# Patient Record
Sex: Female | Born: 1995 | Race: White | Hispanic: No | Marital: Single | Smoking: Never smoker
Health system: Southern US, Community
[De-identification: ages and names within clinical notes are randomized; demographics above are authoritative.]

## PROBLEM LIST (undated history)

## (undated) DIAGNOSIS — M6752 Plica syndrome, left knee: Secondary | ICD-10-CM

---
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
## 2015-08-25 ENCOUNTER — Emergency Department
Admission: EM | Admit: 2015-08-25 | Discharge: 2015-08-25 | Disposition: A | Discharge status: Home / Self Care | Payer: BLUE CROSS/BLUE SHIELD | Source: Home / Self Care | Attending: Family Medicine | Admitting: Family Medicine

## 2015-08-25 ENCOUNTER — Encounter: Payer: Self-pay | Admitting: *Deleted

## 2015-08-25 DIAGNOSIS — J329 Chronic sinusitis, unspecified: Secondary | ICD-10-CM | POA: Diagnosis not present

## 2015-08-25 MED ORDER — DOXYCYCLINE HYCLATE 100 MG PO CAPS: 100.0000 mg | ORAL_CAPSULE | Freq: Two times a day (BID) | ORAL | Status: DC

## 2015-08-25 MED ORDER — FLUTICASONE PROPIONATE 50 MCG/ACT NA SUSP: 2.0000 | Freq: Every day | NASAL | Status: AC

## 2015-08-25 MED ORDER — PSEUDOEPHEDRINE HCL 60 MG PO TABS: 60.0000 mg | ORAL_TABLET | Freq: Four times a day (QID) | ORAL | Status: DC | PRN

## 2015-08-25 MED ORDER — PREDNISONE 20 MG PO TABS: ORAL_TABLET | ORAL | Status: DC

## 2015-08-25 NOTE — Discharge Instructions (Signed)
You may take 400-600mg Ibuprofen (Motrin) every 6-8 hours for fever and pain  °Alternate with Tylenol  °You may take 500mg Tylenol every 4-6 hours as needed for fever and pain  °Follow-up with your primary care provider next week for recheck of symptoms if not improving.  °Be sure to drink plenty of fluids and rest, at least 8hrs of sleep a night, preferably more while you are sick. °Return urgent care or go to closest ER if you cannot keep down fluids/signs of dehydration, fever not reducing with Tylenol, difficulty breathing/wheezing, stiff neck, worsening condition, or other concerns (see below)  °Please take antibiotics as prescribed and be sure to complete entire course even if you start to feel better to ensure infection does not come back. ° °Sinusitis, Adult °Sinusitis is redness, soreness, and inflammation of the paranasal sinuses. Paranasal sinuses are air pockets within the bones of your face. They are located beneath your eyes, in the middle of your forehead, and above your eyes. In healthy paranasal sinuses, mucus is able to drain out, and air is able to circulate through them by way of your nose. However, when your paranasal sinuses are inflamed, mucus and air can become trapped. This can allow bacteria and other germs to grow and cause infection. °Sinusitis can develop quickly and last only a short time (acute) or continue over a long period (chronic). Sinusitis that lasts for more than 12 weeks is considered chronic. °CAUSES °Causes of sinusitis include: °· Allergies. °· Structural abnormalities, such as displacement of the cartilage that separates your nostrils (deviated septum), which can decrease the air flow through your nose and sinuses and affect sinus drainage. °· Functional abnormalities, such as when the small hairs (cilia) that line your sinuses and help remove mucus do not work properly or are not present. °SIGNS AND SYMPTOMS °Symptoms of acute and chronic sinusitis are the same. The  primary symptoms are pain and pressure around the affected sinuses. Other symptoms include: °· Upper toothache. °· Earache. °· Headache. °· Bad breath. °· Decreased sense of smell and taste. °· A cough, which worsens when you are lying flat. °· Fatigue. °· Fever. °· Thick drainage from your nose, which often is green and may contain pus (purulent). °· Swelling and warmth over the affected sinuses. °DIAGNOSIS °Your health care provider will perform a physical exam. During your exam, your health care provider may perform any of the following to help determine if you have acute sinusitis or chronic sinusitis: °· Look in your nose for signs of abnormal growths in your nostrils (nasal polyps). °· Tap over the affected sinus to check for signs of infection. °· View the inside of your sinuses using an imaging device that has a light attached (endoscope). °If your health care provider suspects that you have chronic sinusitis, one or more of the following tests may be recommended: °· Allergy tests. °· Nasal culture. A sample of mucus is taken from your nose, sent to a lab, and screened for bacteria. °· Nasal cytology. A sample of mucus is taken from your nose and examined by your health care provider to determine if your sinusitis is related to an allergy. °TREATMENT °Most cases of acute sinusitis are related to a viral infection and will resolve on their own within 10 days. Sometimes, medicines are prescribed to help relieve symptoms of both acute and chronic sinusitis. These may include pain medicines, decongestants, nasal steroid sprays, or saline sprays. °However, for sinusitis related to a bacterial infection, your health care   provider will prescribe antibiotic medicines. These are medicines that will help kill the bacteria causing the infection. °Rarely, sinusitis is caused by a fungal infection. In these cases, your health care provider will prescribe antifungal medicine. °For some cases of chronic sinusitis, surgery  is needed. Generally, these are cases in which sinusitis recurs more than 3 times per year, despite other treatments. °HOME CARE INSTRUCTIONS °· Drink plenty of water. Water helps thin the mucus so your sinuses can drain more easily. °· Use a humidifier. °· Inhale steam 3-4 times a day (for example, sit in the bathroom with the shower running). °· Apply a warm, moist washcloth to your face 3-4 times a day, or as directed by your health care provider. °· Use saline nasal sprays to help moisten and clean your sinuses. °· Take medicines only as directed by your health care provider. °· If you were prescribed either an antibiotic or antifungal medicine, finish it all even if you start to feel better. °SEEK IMMEDIATE MEDICAL CARE IF: °· You have increasing pain or severe headaches. °· You have nausea, vomiting, or drowsiness. °· You have swelling around your face. °· You have vision problems. °· You have a stiff neck. °· You have difficulty breathing. °  °This information is not intended to replace advice given to you by your health care provider. Make sure you discuss any questions you have with your health care provider. °  °Document Released: 08/12/2005 Document Revised: 09/02/2014 Document Reviewed: 08/27/2011 °Elsevier Interactive Patient Education ©2016 [employer] ° °

## 2015-08-25 NOTE — ED Notes (Signed)
Pt c/o 1 week of sinus issues, runny nose, congestion and sore throat. Afebrile. She was treated with antibiotc and prednisone >1 months ago for similar illness and improved. Again about 2 weeks ago treated with antibiotic and improved. Now has returned 1 week ago.

## 2015-08-25 NOTE — ED Provider Notes (Signed)
CSN: [id number]     Arrival date & time 08/25/15  1122 History   First MD Initiated Contact with Patient 08/25/15 1128     Chief Complaint  Patient presents with  . Nasal Congestion  . Cough  . Sinus Problem   (Consider location/radiation/quality/duration/timing/severity/associated sxs/prior Treatment) HPI Pt is a 19yo female presenting to [facility] with c/o recurrent severe nasal congestion with associated frontal headache, sinus pressure, frequent sneezing, and sore throat.  Symptoms first started about 1 month ago. She was prescribed amoxicillin and prednisone at that time.  Symptoms resolved but then came back about 2 weeks ago.  Pt believes she was prescribed Azithromycin as she only had 5 days of antibiotics. Symptoms did start to go away but have now worsened over the last 1 week.  Denies known hx of seasonal allergies. Denies fever, chills, n/v/d.  Past Medical History  Diagnosis Date  . Plica syndrome of left knee    Past Surgical History  Procedure Laterality Date  . Knee arthroscopy Left 12/17/2012    Procedure: LEFT KNEE ARTHROSCOPY WITH ;  Surgeon: Loreta Ave, MD;  Location: [facility];  Service: Orthopedics;  Laterality: Left;  excision of plica  . Synovectomy Left 12/17/2012    Procedure: SYNOVECTOMY LIMITED ;  Surgeon: Loreta Ave, MD;  Location: [facility];  Service: Orthopedics;  Laterality: Left;   Family History  Problem Relation Age of Onset  . Hypertension Mother    Social History  Substance Use Topics  . Smoking status: Never Smoker   . Smokeless tobacco: None  . Alcohol Use: No   OB History    No data available     Review of Systems  Constitutional: Negative for fever and chills.  HENT: Positive for congestion, postnasal drip, rhinorrhea, sinus pressure, sneezing, sore throat and voice change. Negative for ear pain and trouble swallowing.   Respiratory: Negative for cough and shortness of breath.   Cardiovascular:  Negative for chest pain and palpitations.  Gastrointestinal: Negative for nausea, vomiting, abdominal pain and diarrhea.  Musculoskeletal: Negative for myalgias, back pain and arthralgias.  Skin: Negative for rash.  Neurological: Positive for headaches. Negative for dizziness and light-headedness.    Allergies  Review of patient's allergies indicates no known allergies.  Home Medications   Prior to Admission medications   Medication Sig Start Date End Date Taking? Authorizing Provider  doxycycline (VIBRAMYCIN) 100 MG capsule Take 1 capsule (100 mg total) by mouth 2 (two) times daily. One po bid x 7 days 08/25/15   Junius Finner, PA-C  fluticasone [facility]) 50 MCG/ACT nasal spray Place 2 sprays into both nostrils daily. 08/25/15   Junius Finner, PA-C  predniSONE (DELTASONE) 20 MG tablet 3 tabs po day one, then 2 po daily x 4 days 08/25/15   Junius Finner, PA-C  pseudoephedrine (SUDAFED) 60 MG tablet Take 1 tablet (60 mg total) by mouth every 6 (six) hours as needed for congestion. 08/25/15   Junius Finner, PA-C   Meds Ordered and Administered this Visit  Medications - No data to display  BP 109/72 mmHg  Pulse 102  Temp(Src) 99.1 F (37.3 C) (Oral)  Resp 16  Wt 141 lb (63.957 kg)  SpO2 99%  LMP 08/04/2015 No data found.   Physical Exam  Constitutional: She appears well-developed and well-nourished. No distress.  HENT:  Head: Normocephalic and atraumatic.  Right Ear: Hearing, external ear and ear canal normal. A middle ear effusion is present.  Left Ear: Hearing,  tympanic membrane, external ear and ear canal normal.  Nose: Mucosal edema and rhinorrhea present. Right sinus exhibits maxillary sinus tenderness and frontal sinus tenderness. Left sinus exhibits maxillary sinus tenderness. Left sinus exhibits no frontal sinus tenderness.  Mouth/Throat: Uvula is midline, oropharynx is clear and moist and mucous membranes are normal.  Eyes: Conjunctivae are normal. No scleral icterus.   Neck: Normal range of motion. Neck supple.  Cardiovascular: Normal rate, regular rhythm and normal heart sounds.   Pulmonary/Chest: Effort normal and breath sounds normal. No stridor. No respiratory distress. She has no wheezes. She has no rales. She exhibits no tenderness.  Abdominal: Soft. Bowel sounds are normal. She exhibits no distension and no mass. There is no tenderness. There is no rebound and no guarding.  Musculoskeletal: Normal range of motion.  Lymphadenopathy:    She has no cervical adenopathy.  Neurological: She is alert.  Skin: Skin is warm and dry. She is not diaphoretic.  Nursing note and vitals reviewed.   ED Course  Procedures (including critical care time)  Labs Review Labs Reviewed - No data to display  Imaging Review No results found.     MDM   1. Recurrent rhinosinusitis    Pt c/o recurrent, worsening sinus congestion and pressure.  She has been on amoxicillin and azithromycin with only temporary relief. Temp 99.1 F today in UC.  She does have severe nasal congestion with sinus tenderness.  Pt is not concerned for pregnancy and is not trying to become pregnant.   Will try a course of Doxycycline and another course of prednisone. Encouraged pt to use Flonase and may take sudafed as well. If not improving in 7-10 days, f/u with PCP for recheck of symptoms.  Patient and mother verbalized understanding and agreement with treatment plan.     Junius Finnerrin O'Malley, PA-C 08/25/15 [phone]

## 2018-09-30 DIAGNOSIS — J028 Acute pharyngitis due to other specified organisms: Secondary | ICD-10-CM | POA: Diagnosis not present

## 2018-09-30 DIAGNOSIS — J00 Acute nasopharyngitis [common cold]: Secondary | ICD-10-CM | POA: Diagnosis not present

## 2019-03-09 DIAGNOSIS — Z789 Other specified health status: Secondary | ICD-10-CM | POA: Diagnosis not present

## 2019-03-09 DIAGNOSIS — Z0000 Encounter for general adult medical examination without abnormal findings: Secondary | ICD-10-CM | POA: Diagnosis not present

## 2019-03-09 DIAGNOSIS — Z3041 Encounter for surveillance of contraceptive pills: Secondary | ICD-10-CM | POA: Diagnosis not present

## 2019-03-09 DIAGNOSIS — Z7721 Contact with and (suspected) exposure to potentially hazardous body fluids: Secondary | ICD-10-CM | POA: Diagnosis not present

## 2019-03-09 DIAGNOSIS — Z111 Encounter for screening for respiratory tuberculosis: Secondary | ICD-10-CM | POA: Diagnosis not present

## 2019-03-09 DIAGNOSIS — Z23 Encounter for immunization: Secondary | ICD-10-CM | POA: Diagnosis not present

## 2019-03-29 DIAGNOSIS — Z23 Encounter for immunization: Secondary | ICD-10-CM | POA: Diagnosis not present

## 2019-04-28 DIAGNOSIS — M546 Pain in thoracic spine: Secondary | ICD-10-CM | POA: Diagnosis not present

## 2019-04-28 DIAGNOSIS — S42022A Displaced fracture of shaft of left clavicle, initial encounter for closed fracture: Secondary | ICD-10-CM | POA: Diagnosis not present

## 2019-04-28 DIAGNOSIS — M545 Low back pain: Secondary | ICD-10-CM | POA: Diagnosis not present

## 2019-04-28 DIAGNOSIS — M542 Cervicalgia: Secondary | ICD-10-CM | POA: Diagnosis not present

## 2019-04-28 DIAGNOSIS — S0990XA Unspecified injury of head, initial encounter: Secondary | ICD-10-CM | POA: Diagnosis not present

## 2019-04-28 DIAGNOSIS — Y999 Unspecified external cause status: Secondary | ICD-10-CM | POA: Diagnosis not present

## 2019-04-28 DIAGNOSIS — S3992XA Unspecified injury of lower back, initial encounter: Secondary | ICD-10-CM | POA: Diagnosis not present

## 2019-04-28 DIAGNOSIS — S199XXA Unspecified injury of neck, initial encounter: Secondary | ICD-10-CM | POA: Diagnosis not present

## 2019-04-28 DIAGNOSIS — S301XXA Contusion of abdominal wall, initial encounter: Secondary | ICD-10-CM | POA: Diagnosis not present

## 2019-04-28 DIAGNOSIS — S299XXA Unspecified injury of thorax, initial encounter: Secondary | ICD-10-CM | POA: Diagnosis not present

## 2019-04-28 DIAGNOSIS — Z3202 Encounter for pregnancy test, result negative: Secondary | ICD-10-CM | POA: Diagnosis not present

## 2019-04-28 DIAGNOSIS — S20212A Contusion of left front wall of thorax, initial encounter: Secondary | ICD-10-CM | POA: Diagnosis not present

## 2019-04-28 DIAGNOSIS — Y92410 Unspecified street and highway as the place of occurrence of the external cause: Secondary | ICD-10-CM | POA: Diagnosis not present

## 2019-04-28 DIAGNOSIS — S42002A Fracture of unspecified part of left clavicle, initial encounter for closed fracture: Secondary | ICD-10-CM | POA: Diagnosis not present

## 2019-04-29 DIAGNOSIS — S42025A Nondisplaced fracture of shaft of left clavicle, initial encounter for closed fracture: Secondary | ICD-10-CM | POA: Diagnosis not present

## 2019-04-29 DIAGNOSIS — S42002A Fracture of unspecified part of left clavicle, initial encounter for closed fracture: Secondary | ICD-10-CM | POA: Insufficient documentation

## 2019-05-06 DIAGNOSIS — S42025A Nondisplaced fracture of shaft of left clavicle, initial encounter for closed fracture: Secondary | ICD-10-CM | POA: Diagnosis not present

## 2019-05-10 DIAGNOSIS — S42025D Nondisplaced fracture of shaft of left clavicle, subsequent encounter for fracture with routine healing: Secondary | ICD-10-CM | POA: Diagnosis not present

## 2019-05-10 DIAGNOSIS — Z789 Other specified health status: Secondary | ICD-10-CM | POA: Diagnosis not present

## 2019-05-10 DIAGNOSIS — Z23 Encounter for immunization: Secondary | ICD-10-CM | POA: Diagnosis not present

## 2019-05-20 DIAGNOSIS — S42025A Nondisplaced fracture of shaft of left clavicle, initial encounter for closed fracture: Secondary | ICD-10-CM | POA: Diagnosis not present

## 2019-06-03 DIAGNOSIS — S42025A Nondisplaced fracture of shaft of left clavicle, initial encounter for closed fracture: Secondary | ICD-10-CM | POA: Diagnosis not present

## 2019-06-25 DIAGNOSIS — S42025A Nondisplaced fracture of shaft of left clavicle, initial encounter for closed fracture: Secondary | ICD-10-CM | POA: Diagnosis not present

## 2019-07-13 DIAGNOSIS — R102 Pelvic and perineal pain: Secondary | ICD-10-CM | POA: Diagnosis not present

## 2019-07-13 DIAGNOSIS — R002 Palpitations: Secondary | ICD-10-CM | POA: Diagnosis not present

## 2019-07-13 DIAGNOSIS — R079 Chest pain, unspecified: Secondary | ICD-10-CM | POA: Diagnosis not present

## 2019-07-26 DIAGNOSIS — N83292 Other ovarian cyst, left side: Secondary | ICD-10-CM | POA: Diagnosis not present

## 2019-07-26 DIAGNOSIS — N83202 Unspecified ovarian cyst, left side: Secondary | ICD-10-CM | POA: Diagnosis not present

## 2019-07-30 DIAGNOSIS — S42025A Nondisplaced fracture of shaft of left clavicle, initial encounter for closed fracture: Secondary | ICD-10-CM | POA: Diagnosis not present

## 2019-08-12 ENCOUNTER — Other Ambulatory Visit: Payer: Self-pay

## 2019-08-12 ENCOUNTER — Emergency Department (INDEPENDENT_AMBULATORY_CARE_PROVIDER_SITE_OTHER): Payer: BC Managed Care – PPO

## 2019-08-12 ENCOUNTER — Emergency Department
Admission: EM | Admit: 2019-08-12 | Discharge: 2019-08-12 | Disposition: A | Discharge status: Home / Self Care | Payer: BC Managed Care – PPO | Source: Home / Self Care

## 2019-08-12 ENCOUNTER — Encounter: Payer: Self-pay | Admitting: Emergency Medicine

## 2019-08-12 DIAGNOSIS — G44309 Post-traumatic headache, unspecified, not intractable: Secondary | ICD-10-CM | POA: Diagnosis not present

## 2019-08-12 DIAGNOSIS — S060X0A Concussion without loss of consciousness, initial encounter: Secondary | ICD-10-CM | POA: Diagnosis not present

## 2019-08-12 DIAGNOSIS — S0083XA Contusion of other part of head, initial encounter: Secondary | ICD-10-CM | POA: Diagnosis not present

## 2019-08-12 NOTE — ED Triage Notes (Signed)
Pt fell and busted her right side of her face on rocks Saturday night. States her left side of her head is hurting and getting worse.

## 2019-08-12 NOTE — Discharge Instructions (Signed)
Tylenol for headaches.  Return if any problems.

## 2019-08-13 NOTE — ED Provider Notes (Signed)
[facility]    CSN: [id number] Arrival date & time: 08/12/19  1106      History   Chief Complaint Chief Complaint  Patient presents with  . Headache    HPI Leah Ray is a 23 y.o. female.   Pt reports she drank to much on Saturday night.  Pt states she fell and hit her head and her face. Pt complains of bruising and swelling.  Pt reports she has been dizzy and has a headache.  Pt complains of nausea   The history is provided by the patient. No language interpreter was used.    Past Medical History:  Diagnosis Date  . Plica syndrome of left knee     Patient Active Problem List   Diagnosis Date Noted  . ACUTE PHARYNGITIS 06/16/2011    Past Surgical History:  Procedure Laterality Date  . KNEE ARTHROSCOPY Left 12/17/2012   Procedure: LEFT KNEE ARTHROSCOPY WITH ;  Surgeon: Ninetta Lights, MD;  Location: [place];  Service: Orthopedics;  Laterality: Left;  excision of plica  . SYNOVECTOMY Left 12/17/2012   Procedure: SYNOVECTOMY LIMITED ;  Surgeon: Ninetta Lights, MD;  Location: [place];  Service: Orthopedics;  Laterality: Left;    OB History   No obstetric history on file.      Home Medications    Prior to Admission medications   Medication Sig Start Date End Date Taking? Authorizing Provider  doxycycline (VIBRAMYCIN) 100 MG capsule Take 1 capsule (100 mg total) by mouth 2 (two) times daily. One po bid x 7 days 08/25/15   Noe Gens, PA-C  fluticasone [facility]) 50 MCG/ACT nasal spray Place 2 sprays into both nostrils daily. 08/25/15   Noe Gens, PA-C  predniSONE (DELTASONE) 20 MG tablet 3 tabs po day one, then 2 po daily x 4 days 08/25/15   Noe Gens, PA-C  pseudoephedrine (SUDAFED) 60 MG tablet Take 1 tablet (60 mg total) by mouth every 6 (six) hours as needed for congestion. 08/25/15   Noe Gens, PA-C    Family History Family History  Problem Relation Age of Onset  . Hypertension Mother       Social History Social History   Tobacco Use  . Smoking status: Never Smoker  . Smokeless tobacco: Never Used  Substance Use Topics  . Alcohol use: Yes  . Drug use: No     Allergies   Patient has no known allergies.   Review of Systems Review of Systems  All other systems reviewed and are negative.    Physical Exam Triage Vital Signs ED Triage Vitals  Enc Vitals Group     BP 08/12/19 1130 109/60     Pulse Rate 08/12/19 1130 90     Resp --      Temp 08/12/19 1130 98.6 F (37 C)     Temp Source 08/12/19 1130 Oral     SpO2 08/12/19 1130 99 %     Weight 08/12/19 1127 150 lb (68 kg)     Height --      Head Circumference --      Peak Flow --      Pain Score 08/12/19 1126 7     Pain Loc --      Pain Edu? --      Excl. in [place]? --    No data found.  Updated Vital Signs BP 109/60 (BP Location: Right Arm)   Pulse 90   Temp 98.6  F (37 C) (Oral)   Wt 68 kg   LMP 07/29/2019 (Approximate)   SpO2 99%   Visual Acuity Right Eye Distance:   Left Eye Distance:   Bilateral Distance:    Right Eye Near:   Left Eye Near:    Bilateral Near:     Physical Exam Vitals and nursing note reviewed.  Constitutional:      Appearance: She is well-developed.  HENT:     Head: Normocephalic.     Comments: Bruising face, multiple abrasions     Mouth/Throat:     Mouth: Mucous membranes are moist.  Eyes:     Extraocular Movements: Extraocular movements intact.  Cardiovascular:     Rate and Rhythm: Normal rate.  Pulmonary:     Effort: Pulmonary effort is normal.  Abdominal:     General: There is no distension.  Musculoskeletal:        General: Normal range of motion.     Cervical back: Normal range of motion.  Neurological:     Mental Status: She is alert and oriented to person, place, and time.  Psychiatric:        Mood and Affect: Mood normal.      UC Treatments / Results  Labs (all labs ordered are listed, but only abnormal results are displayed) Labs  Reviewed - No data to display  EKG   Radiology CT Head Wo Contrast  Result Date: 08/12/2019 CLINICAL DATA:  Posttraumatic headache EXAM: CT HEAD WITHOUT CONTRAST TECHNIQUE: Contiguous axial images were obtained from the base of the skull through the vertex without intravenous contrast. COMPARISON:  04/28/2019 FINDINGS: Brain: No evidence of acute infarction, hemorrhage, hydrocephalus, extra-axial collection or mass lesion/mass effect. Vascular: No hyperdense vessel or unexpected calcification. Skull: Normal. Negative for fracture or focal lesion. Sinuses/Orbits: No acute finding. IMPRESSION: Negative head CT. Electronically Signed   By: Marnee Spring M.D.   On: 08/12/2019 11:56    Procedures Procedures (including critical care time)  Medications Ordered in UC Medications - No data to display  Initial Impression / Assessment and Plan / UC Course  I have reviewed the triage vital signs and the nursing notes.  Pertinent labs & imaging results that were available during my care of the patient were reviewed by me and considered in my medical decision making (see chart for details).     MDM Ct head and face reviewed and discussed with pt  Pt counseled on head injuries.  I suspect pt has a concussion.   Final Clinical Impressions(s) / UC Diagnoses   Final diagnoses:  Contusion of face, initial encounter  Concussion without loss of consciousness, initial encounter     Discharge Instructions     Tylenol for headaches.  Return if any problems.     ED Prescriptions    None     PDMP not reviewed this encounter.  An After Visit Summary was printed and given to the patient.    Elson Areas, [place] 08/13/19 1008

## 2019-10-07 ENCOUNTER — Ambulatory Visit (INDEPENDENT_AMBULATORY_CARE_PROVIDER_SITE_OTHER): Payer: BC Managed Care – PPO | Admitting: Family Medicine

## 2019-10-07 ENCOUNTER — Encounter: Payer: Self-pay | Admitting: Family Medicine

## 2019-10-07 ENCOUNTER — Other Ambulatory Visit: Payer: Self-pay

## 2019-10-07 VITALS — BP 107/71 | HR 87 | Temp 98.0°F | Ht 65.0 in | Wt 163.0 lb

## 2019-10-07 DIAGNOSIS — N83202 Unspecified ovarian cyst, left side: Secondary | ICD-10-CM | POA: Diagnosis not present

## 2019-10-07 DIAGNOSIS — Z3041 Encounter for surveillance of contraceptive pills: Secondary | ICD-10-CM | POA: Diagnosis not present

## 2019-10-07 DIAGNOSIS — M5432 Sciatica, left side: Secondary | ICD-10-CM

## 2019-10-07 DIAGNOSIS — M5430 Sciatica, unspecified side: Secondary | ICD-10-CM | POA: Insufficient documentation

## 2019-10-07 NOTE — Progress Notes (Signed)
Leah Ray - 24 y.o. female MRN [id number]  Date of birth: 07/01/1996  Subjective Chief Complaint  Patient presents with  . Establish Care    HPI Leah Ray is a 24 y.o. female here today for initial visit.  She was seen at the end of last year by previous provider with complaint of pelvic pain.  Ultrasound completed showing ovarian cyst.  Recommended to have 6 week f/u to ensure resolution.  She reports that she still has had intermittent pelvic pain.  She would like referral to Gyn for f/u of the cyst, PAP and discuss OCP.  She denies abnormal bleeding or discharge.   She has also had some issues with sciatica.  She exercises daily, this does not really seem to make worse.  She denies pain currently.    ROS:  A comprehensive ROS was completed and negative except as noted per HPI  No Known Allergies  Past Medical History:  Diagnosis Date  . Plica syndrome of left knee     Past Surgical History:  Procedure Laterality Date  . KNEE ARTHROSCOPY Left 12/17/2012   Procedure: LEFT KNEE ARTHROSCOPY WITH ;  Surgeon: Loreta Ave, MD;  Location: [facility];  Service: Orthopedics;  Laterality: Left;  excision of plica  . SYNOVECTOMY Left 12/17/2012   Procedure: SYNOVECTOMY LIMITED ;  Surgeon: Loreta Ave, MD;  Location: [facility];  Service: Orthopedics;  Laterality: Left;    Social History   Socioeconomic History  . Marital status: Single    Spouse name: Not on file  . Number of children: Not on file  . Years of education: Not on file  . Highest education level: Not on file  Occupational History  . Not on file  Tobacco Use  . Smoking status: Never Smoker  . Smokeless tobacco: Never Used  Substance and Sexual Activity  . Alcohol use: Yes  . Drug use: No  . Sexual activity: Never  Other Topics Concern  . Not on file  Social History Narrative  . Not on file   Social Determinants of Health   Financial Resource Strain:   . Difficulty of  Paying Living Expenses: Not on file  Food Insecurity:   . Worried About Programme researcher, broadcasting/film/video in the Last Year: Not on file  . Ran Out of Food in the Last Year: Not on file  Transportation Needs:   . Lack of Transportation (Medical): Not on file  . Lack of Transportation (Non-Medical): Not on file  Physical Activity:   . Days of Exercise per Week: Not on file  . Minutes of Exercise per Session: Not on file  Stress:   . Feeling of Stress : Not on file  Social Connections:   . Frequency of Communication with Friends and Family: Not on file  . Frequency of Social Gatherings with Friends and Family: Not on file  . Attends Religious Services: Not on file  . Active Member of Clubs or Organizations: Not on file  . Attends Banker Meetings: Not on file  . Marital Status: Not on file    Family History  Problem Relation Age of Onset  . Hypertension Mother   . Birth defects Paternal Grandmother        Colon    Health Maintenance  Topic Date Due  . HIV Screening  10/06/2020 (Originally 04/29/2010)  . PAP-Cervical Cytology Screening  08/26/2021  . PAP SMEAR-Modifier  08/26/2021  . TETANUS/TDAP  03/08/2029  . INFLUENZA  VACCINE  Completed    ----------------------------------------------------------------------------------------------------------------------------------------------------------------------------------------------------------------- Physical Exam BP 107/71   Pulse 87   Temp 98 F (36.7 C) (Oral)   Ht 5\' 5"  (1.651 m)   Wt 163 lb (73.9 kg)   BMI 27.12 kg/m   Physical Exam Constitutional:      Appearance: Normal appearance.  HENT:     Mouth/Throat:     Mouth: Mucous membranes are moist.  Eyes:     General: No scleral icterus. Cardiovascular:     Rate and Rhythm: Normal rate and regular rhythm.  Pulmonary:     Effort: Pulmonary effort is normal.     Breath sounds: Normal breath sounds.  Musculoskeletal:     Cervical back: Neck supple.  Skin:     General: Skin is warm and dry.  Neurological:     General: No focal deficit present.     Mental Status: She is alert.  Psychiatric:        Mood and Affect: Mood normal.        Behavior: Behavior normal.     ------------------------------------------------------------------------------------------------------------------------------------------------------------------------------------------------------------------- Assessment and Plan  Cyst of left ovary Referral entered to Gyn per patients preference for f/u of ovarian cyst and OCP mgmt.   Sciatica Given handout for HEP/Stretches for piriformis prior to exercise.  Can try OMT for management if flares up in the future.      This visit occurred during the SARS-CoV-2 public health emergency.  Safety protocols were in place, including screening questions prior to the visit, additional usage of staff PPE, and extensive cleaning of exam room while observing appropriate contact time as indicated for disinfecting solutions.

## 2019-10-07 NOTE — Patient Instructions (Signed)
Great to meet you! Try stretches below if sciatica is bothersome.  I have entered referral to GYN, you should hear from them soon.  Please contact us with any questions.    Piriformis Syndrome Rehab Ask your health care provider which exercises are safe for you. Do exercises exactly as told by your health care provider and adjust them as directed. It is normal to feel mild stretching, pulling, tightness, or discomfort as you do these exercises. Stop right away if you feel sudden pain or your pain gets worse. Do not begin these exercises until told by your health care provider. Stretching and range-of-motion exercises These exercises warm up your muscles and joints and improve the movement and flexibility of your hip and pelvis. The exercises also help to relieve pain, numbness, and tingling. Hip rotation This is an exercise in which you lie on your back and stretch the muscles that rotate your hip (hip rotators) to stretch your buttocks. 1. Lie on your back on a firm surface. 2. Pull your left / right knee toward your same shoulder with your left / right hand until your knee is pointing toward the ceiling. Hold your left / right ankle with your other hand. 3. Keeping your knee steady, gently pull your left / right ankle toward your other shoulder until you feel a stretch in your buttocks. 4. Hold this position for __________ seconds. Repeat __________ times. Complete this exercise __________ times a day. Hip extensor This is an exercise in which you lie on your back and pull your knee to your chest. 1. Lie on your back on a firm surface. Both of your legs should be straight. 2. Pull your left / right knee to your chest. Hold your leg in this position by holding onto the back of your thigh or the front of your knee. 3. Hold this position for __________ seconds. 4. Slowly return to the starting position. Repeat __________ times. Complete this exercise __________ times a day. Strengthening  exercises These exercises build strength and endurance in your hip and thigh muscles. Endurance is the ability to use your muscles for a long time, even after they get tired. Straight leg raises, side-lying This exercise strengthens the muscles that rotate the leg at the hip and move it away from your body (hip abductors). 1. Lie on your side with your left / right leg in the top position. Lie so your head, shoulder, knee, and hip line up. Bend your bottom knee to help you balance. 2. Lift your top leg 4-6 inches (10-15 cm) while keeping your toes pointed straight ahead. 3. Hold this position for __________ seconds. 4. Slowly lower your leg to the starting position. 5. Let your muscles relax completely after each repetition. Repeat __________ times. Complete this exercise __________ times a day. Hip abduction and rotation This is sometimes called quadruped (on hands and knees) exercises. 1. Get on your hands and knees on a firm, lightly padded surface. Your hands should be directly below your shoulders, and your knees should be directly below your hips. 2. Lift your left / right knee out to the side. Keep your knee bent. Do not twist your body. 3. Hold this position for __________ seconds. 4. Slowly lower your leg. Repeat __________ times. Complete this exercise __________ times a day. Straight leg raises, face-down This exercise stretches the muscles that move your hips away from the front of the pelvis (hip extensors). 1. Lie on your abdomen on a bed or a firm surface  with a pillow under your hips. 2. Squeeze your buttocks muscles and lift your left / right leg about 4-6 inches (10-15 cm) off the bed. Do not let your back arch. 3. Hold this position for __________ seconds. 4. Slowly lower your leg to the starting position. 5. Let your muscles relax completely after each repetition. Repeat __________ times. Complete this exercise __________ times a day. This information is not intended to  replace advice given to you by your health care provider. Make sure you discuss any questions you have with your health care provider. Document Revised: 12/03/2018 Document Reviewed: 06/04/2018 Elsevier Patient Education  [employer].

## 2019-10-07 NOTE — Assessment & Plan Note (Signed)
Given handout for HEP/Stretches for piriformis prior to exercise.  Can try OMT for management if flares up in the future.

## 2019-10-07 NOTE — Assessment & Plan Note (Signed)
Referral entered to Gyn per patients preference for f/u of ovarian cyst and OCP mgmt.

## 2019-10-28 ENCOUNTER — Ambulatory Visit: Payer: BC Managed Care – PPO | Admitting: Obstetrics & Gynecology

## 2019-10-28 ENCOUNTER — Other Ambulatory Visit: Payer: Self-pay

## 2019-10-28 ENCOUNTER — Encounter: Payer: Self-pay | Admitting: Obstetrics & Gynecology

## 2019-10-28 VITALS — BP 116/75 | HR 115 | Temp 98.5°F | Resp 16 | Ht 65.0 in | Wt 155.0 lb

## 2019-10-28 DIAGNOSIS — Z113 Encounter for screening for infections with a predominantly sexual mode of transmission: Secondary | ICD-10-CM

## 2019-10-28 DIAGNOSIS — R102 Pelvic and perineal pain: Secondary | ICD-10-CM

## 2019-10-28 NOTE — Progress Notes (Signed)
   Subjective:    Patient ID: Leah Ray, female    DOB: 05-16-1996, 24 y.o.   MRN: [id number]  HPI 24 yo single P0 here today with a h/o random left lower quadrant pain. This first started 10/20 and she had a gyn u/s with [facility] 11/20 that showed an ovarian cyst. She rates the pain when it is present as a 7 or 8. She has tried IBU 200-400 mg which helps relieve the pain. She doesn't like to take many meds. Sometimes it is worse with/right before her periods. She denies dyspareunia. She uses OCPs for contraception. Her periods generally last 5-7 days, generally moderate/light.   Review of Systems She is a Consulting civil engineer at [employer], pre requesites for nursing. Monogamous since 10/20 She had had flu vaccine. 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 Her pap was normal in 2019.  She had the Gardasil series.    Objective:   Physical Exam Breathing, conversing, and ambulating normally Well nourished, well hydrated White female, no apparent distress Abd- benign Bimanual- normal size and shape, anteverted, mobile, non-tender, normal adnexal exam     Assessment & Plan:  Pelvic pain (LLQ)- check gyn u/s Check cervical cultures Virtual visit after u/s for results

## 2019-10-29 LAB — CERVICOVAGINAL ANCILLARY ONLY
Chlamydia: NEGATIVE
Comment: NEGATIVE
Comment: NORMAL
Neisseria Gonorrhea: NEGATIVE

## 2019-11-02 ENCOUNTER — Other Ambulatory Visit: Payer: Self-pay

## 2019-11-02 ENCOUNTER — Ambulatory Visit (INDEPENDENT_AMBULATORY_CARE_PROVIDER_SITE_OTHER): Payer: BC Managed Care – PPO

## 2019-11-02 DIAGNOSIS — R102 Pelvic and perineal pain: Secondary | ICD-10-CM

## 2019-11-09 ENCOUNTER — Encounter: Payer: Self-pay | Admitting: Family Medicine

## 2019-11-09 ENCOUNTER — Telehealth (INDEPENDENT_AMBULATORY_CARE_PROVIDER_SITE_OTHER): Payer: BC Managed Care – PPO | Admitting: Family Medicine

## 2019-11-09 DIAGNOSIS — N83202 Unspecified ovarian cyst, left side: Secondary | ICD-10-CM | POA: Diagnosis not present

## 2019-11-09 DIAGNOSIS — Z3009 Encounter for other general counseling and advice on contraception: Secondary | ICD-10-CM | POA: Diagnosis not present

## 2019-11-09 NOTE — Assessment & Plan Note (Signed)
Resolved - no more pain

## 2019-11-09 NOTE — Progress Notes (Signed)
TELEHEALTH GYNECOLOGY VIRTUAL VIDEO VISIT ENCOUNTER NOTE  Provider location: [facility] for [facility] at [place]   I connected with Leah Ray on 11/09/19 at  2:15 PM EDT by MyChart Video Encounter at home and verified that I am speaking with the correct person using two identifiers.   I discussed the limitations, risks, security and privacy concerns of performing an evaluation and management service virtually and the availability of in person appointments. I also discussed with the patient that there may be a patient responsible charge related to this service. The patient expressed understanding and agreed to proceed.   History:  Leah Ray is a 24 y.o. [place] female being evaluated today for follow-up pelvic pain. Seen by Dr. Hulan Fray after u/s 12/20 showed ovarian cyst. Started on OCs. Reports her pain is completely resolved.  Interested in having an IUD. She denies any abnormal vaginal discharge, bleeding, pelvic pain or other concerns.       Past Medical History:  Diagnosis Date  . Plica syndrome of left knee    Past Surgical History:  Procedure Laterality Date  . KNEE ARTHROSCOPY Left 12/17/2012   Procedure: LEFT KNEE ARTHROSCOPY WITH ;  Surgeon: Ninetta Lights, MD;  Location: [place];  Service: Orthopedics;  Laterality: Left;  excision of plica  . SYNOVECTOMY Left 12/17/2012   Procedure: SYNOVECTOMY LIMITED ;  Surgeon: Ninetta Lights, MD;  Location: [place];  Service: Orthopedics;  Laterality: Left;   The following portions of the patient's history were reviewed and updated as appropriate: allergies, current medications, past family history, past medical history, past social history, past surgical history and problem list.   Health Maintenance:  Normal pap on 08/2018.    Review of Systems:  Pertinent items noted in HPI and remainder of comprehensive ROS otherwise negative.  Physical Exam:   General:  Alert, oriented and  cooperative. Patient appears to be in no acute distress.  Mental Status: Normal mood and affect. Normal behavior. Normal judgment and thought content.   Respiratory: Normal respiratory effort, no problems with respiration noted  Rest of physical exam deferred due to type of encounter  Labs and Imaging Results for orders placed or performed in visit on 10/28/19 (from the past 336 hour(s))  Cervicovaginal ancillary only( [place])   Collection Time: 10/28/19  3:23 PM  Result Value Ref Range   Chlamydia Negative    Neisseria Gonorrhea Negative    Comment Normal Reference [place] Chlamydia - Negative    Comment      Normal Reference Range Neisseria Gonorrhea - Negative   US PELVIS TRANSVAGINAL NON-OB (TV ONLY)  Result Date: 11/02/2019 CLINICAL DATA:  Pelvic pain for several months.  LMP 10/22/2019. EXAM: ULTRASOUND PELVIS TRANSVAGINAL TECHNIQUE: Transvaginal ultrasound examination of the pelvis was performed including evaluation of the uterus, ovaries, adnexal regions, and pelvic cul-de-sac. COMPARISON:  None. FINDINGS: Uterus Measurements: 6.6 x 2.7 x 3.7 cm = volume: 34 mL. No fibroids or other mass visualized. Endometrium Thickness: 4 mm.  No focal abnormality visualized. Right ovary Measurements: 3.6 x 2.1 x 1.9 cm = volume: 8 mL. Normal appearance/no adnexal mass. Left ovary Measurements: 3.8 x 3.0 x 3.1 cm = volume: 18 mL. Normal appearance/no adnexal mass. Other findings:  No abnormal free fluid IMPRESSION: Negative. No pelvic mass or other significant abnormality identified. Electronically Signed   By: Marlaine Hind M.D.   On: 11/02/2019 14:46       Assessment and Plan:   Problem List  Items Addressed This Visit      Unprioritized   Cyst of left ovary - Primary    Resolved - no more pain       Other Visit Diagnoses    Encounter for counseling regarding contraception       R/B/A of IUDs discussed at length. She will consider and may schedule as needed.       I discussed the  assessment and treatment plan with the patient. The patient was provided an opportunity to ask questions and all were answered. The patient agreed with the plan and demonstrated an understanding of the instructions.   The patient was advised to call back or seek an in-person evaluation/go to the ED if the symptoms worsen or if the condition fails to improve as anticipated.  I provided 12 minutes of face-to-face time during this encounter.   Reva Bores, MD [facility] for [employer], [facility]

## 2019-11-21 DIAGNOSIS — R350 Frequency of micturition: Secondary | ICD-10-CM | POA: Diagnosis not present

## 2019-11-21 DIAGNOSIS — N390 Urinary tract infection, site not specified: Secondary | ICD-10-CM | POA: Diagnosis not present

## 2019-12-21 DIAGNOSIS — Z23 Encounter for immunization: Secondary | ICD-10-CM | POA: Diagnosis not present

## 2020-01-28 DIAGNOSIS — R350 Frequency of micturition: Secondary | ICD-10-CM | POA: Diagnosis not present

## 2020-01-28 DIAGNOSIS — N3000 Acute cystitis without hematuria: Secondary | ICD-10-CM | POA: Diagnosis not present

## 2020-01-28 DIAGNOSIS — N390 Urinary tract infection, site not specified: Secondary | ICD-10-CM | POA: Diagnosis not present

## 2020-02-17 DIAGNOSIS — N930 Postcoital and contact bleeding: Secondary | ICD-10-CM | POA: Diagnosis not present

## 2020-02-17 DIAGNOSIS — Z309 Encounter for contraceptive management, unspecified: Secondary | ICD-10-CM | POA: Diagnosis not present

## 2020-03-15 DIAGNOSIS — Z30430 Encounter for insertion of intrauterine contraceptive device: Secondary | ICD-10-CM | POA: Diagnosis not present

## 2020-03-15 DIAGNOSIS — Z3202 Encounter for pregnancy test, result negative: Secondary | ICD-10-CM | POA: Diagnosis not present

## 2020-05-31 DIAGNOSIS — Z30431 Encounter for routine checking of intrauterine contraceptive device: Secondary | ICD-10-CM | POA: Diagnosis not present

## 2020-05-31 DIAGNOSIS — T8332XA Displacement of intrauterine contraceptive device, initial encounter: Secondary | ICD-10-CM | POA: Diagnosis not present

## 2020-08-14 DIAGNOSIS — R059 Cough, unspecified: Secondary | ICD-10-CM | POA: Diagnosis not present

## 2020-08-14 DIAGNOSIS — U071 COVID-19: Secondary | ICD-10-CM | POA: Diagnosis not present

## 2020-10-09 DIAGNOSIS — F4320 Adjustment disorder, unspecified: Secondary | ICD-10-CM | POA: Diagnosis not present

## 2020-10-24 DIAGNOSIS — F4320 Adjustment disorder, unspecified: Secondary | ICD-10-CM | POA: Diagnosis not present

## 2020-10-26 DIAGNOSIS — Z3202 Encounter for pregnancy test, result negative: Secondary | ICD-10-CM | POA: Diagnosis not present

## 2020-10-26 DIAGNOSIS — R300 Dysuria: Secondary | ICD-10-CM | POA: Diagnosis not present

## 2020-11-09 DIAGNOSIS — F4320 Adjustment disorder, unspecified: Secondary | ICD-10-CM | POA: Diagnosis not present

## 2020-12-04 DIAGNOSIS — Z87440 Personal history of urinary (tract) infections: Secondary | ICD-10-CM | POA: Diagnosis not present

## 2021-01-11 DIAGNOSIS — F4320 Adjustment disorder, unspecified: Secondary | ICD-10-CM | POA: Diagnosis not present

## 2021-01-12 DIAGNOSIS — Z131 Encounter for screening for diabetes mellitus: Secondary | ICD-10-CM | POA: Diagnosis not present

## 2021-01-12 DIAGNOSIS — L700 Acne vulgaris: Secondary | ICD-10-CM | POA: Diagnosis not present

## 2021-01-12 DIAGNOSIS — R635 Abnormal weight gain: Secondary | ICD-10-CM | POA: Diagnosis not present

## 2021-01-12 DIAGNOSIS — Z13220 Encounter for screening for lipoid disorders: Secondary | ICD-10-CM | POA: Diagnosis not present

## 2021-01-12 DIAGNOSIS — Z0000 Encounter for general adult medical examination without abnormal findings: Secondary | ICD-10-CM | POA: Diagnosis not present

## 2021-03-23 DIAGNOSIS — R140 Abdominal distension (gaseous): Secondary | ICD-10-CM | POA: Diagnosis not present

## 2021-03-26 DIAGNOSIS — F4320 Adjustment disorder, unspecified: Secondary | ICD-10-CM | POA: Diagnosis not present

## 2021-04-23 DIAGNOSIS — Z713 Dietary counseling and surveillance: Secondary | ICD-10-CM | POA: Diagnosis not present

## 2021-08-11 IMAGING — CT CT HEAD W/O CM
3 series · 15 of 46 positions shown, 18 images · non-contrast
Comparison: 04/28/2019

CLINICAL DATA: Posttraumatic headache

EXAM:
CT HEAD WITHOUT CONTRAST
TECHNIQUE: Contiguous axial images were obtained from the base of the skull
through the vertex without intravenous contrast.

[Series 2: head wo · axial · 0.39mm/px · z∈[-88,+32]mm · 9 of 29 slices shown, 12 images]
[im 3/29  brain]
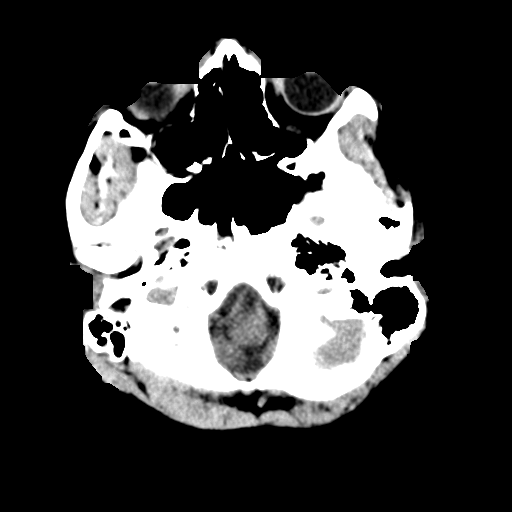
[im 3/29  bone]
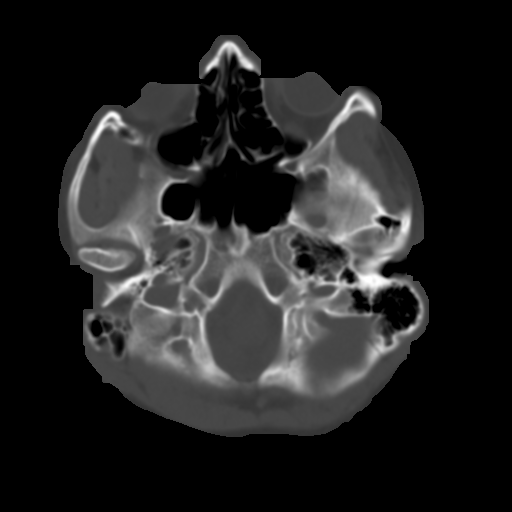
[im 6/29  brain]
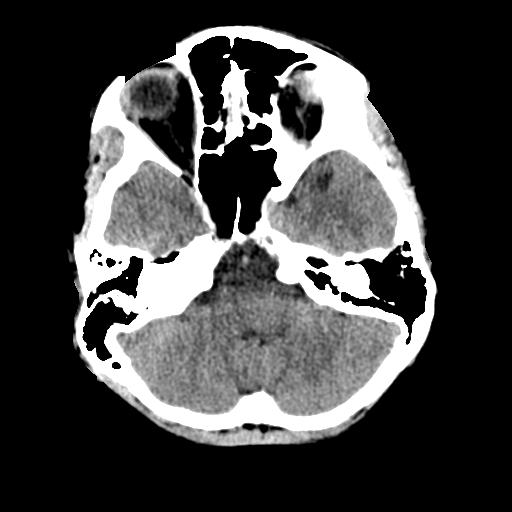
[im 9/29  brain]
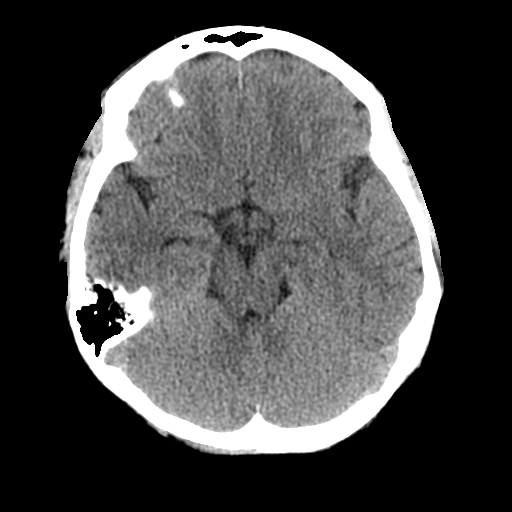
[im 12/29  brain]
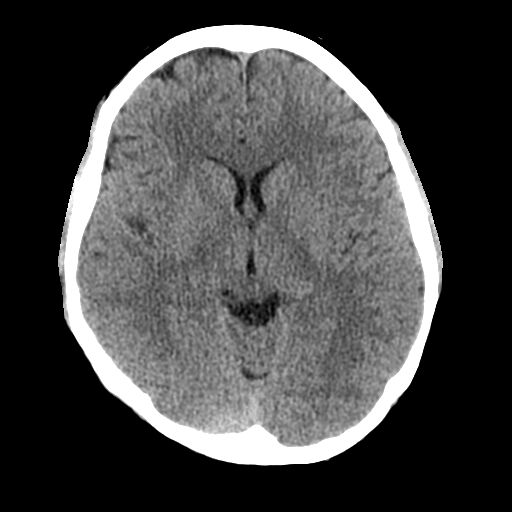
[im 15/29  brain]
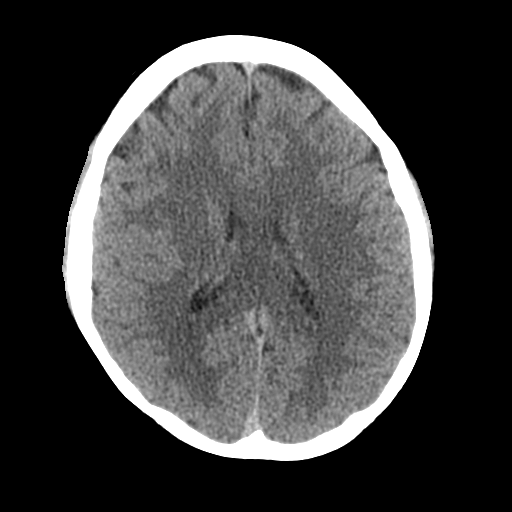
[im 15/29  bone]
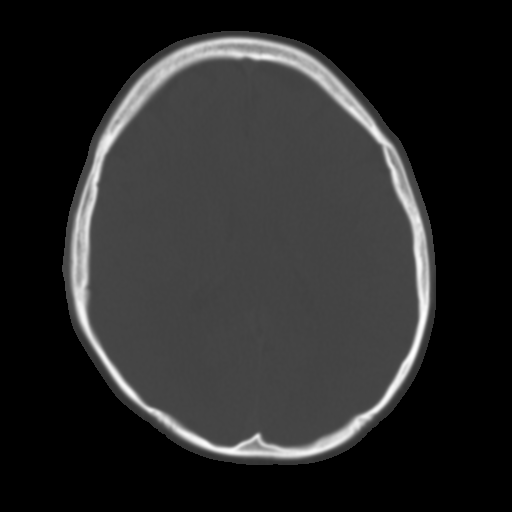
[im 18/29  brain]
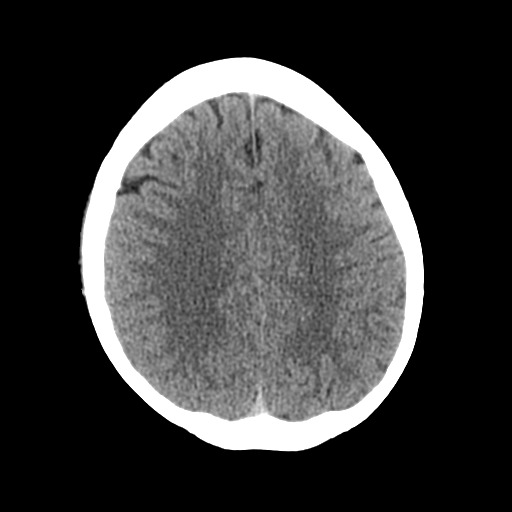
[im 21/29  brain]
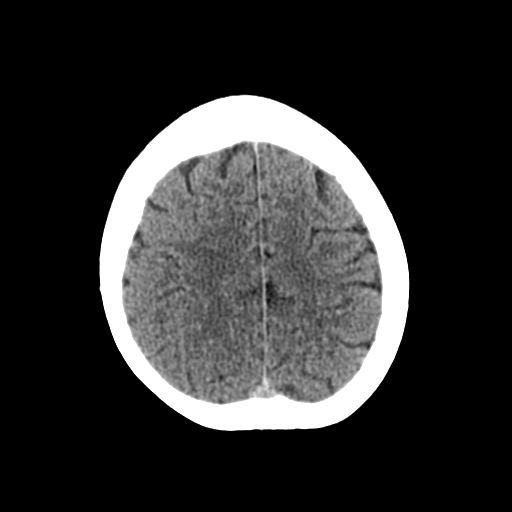
[im 24/29  brain]
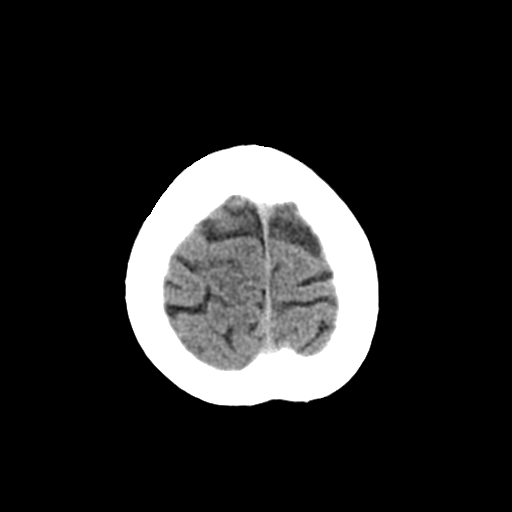
[im 27/29  brain]
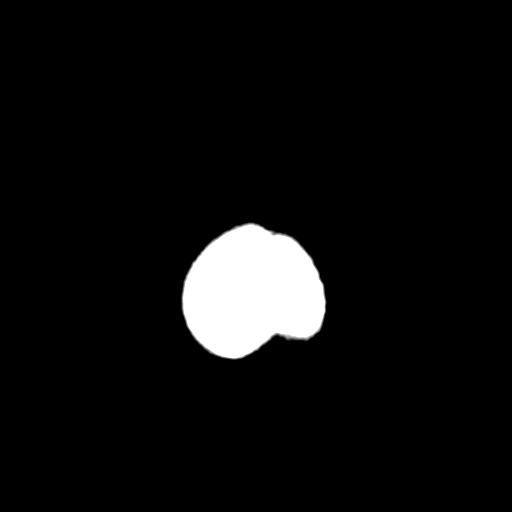
[im 27/29  bone]
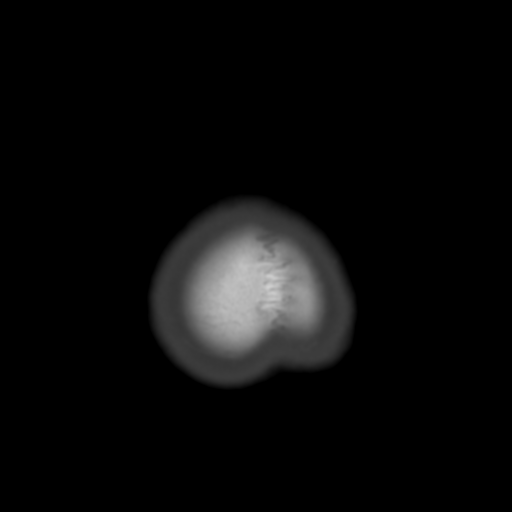

[Series 4: head wo coronal · coronal · 0.29mm/px · 3 of 65 slices shown]
[im 22/65  brain]
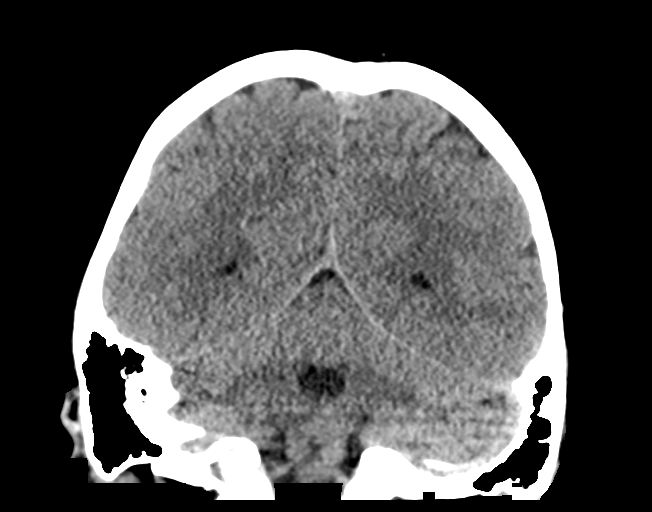
[im 29/65  brain]
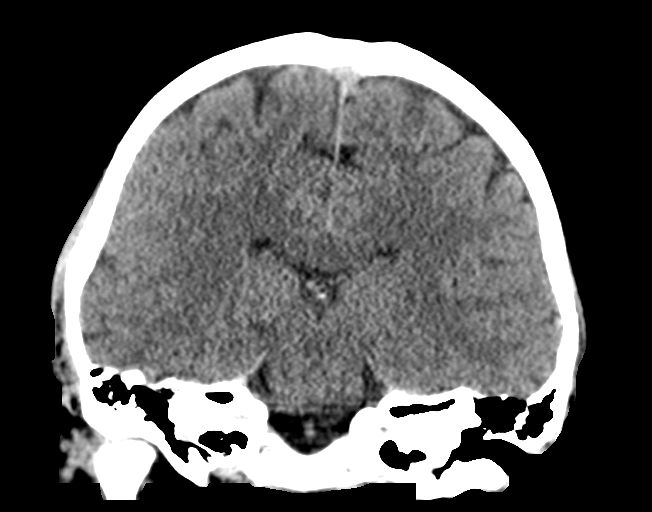
[im 36/65  brain]
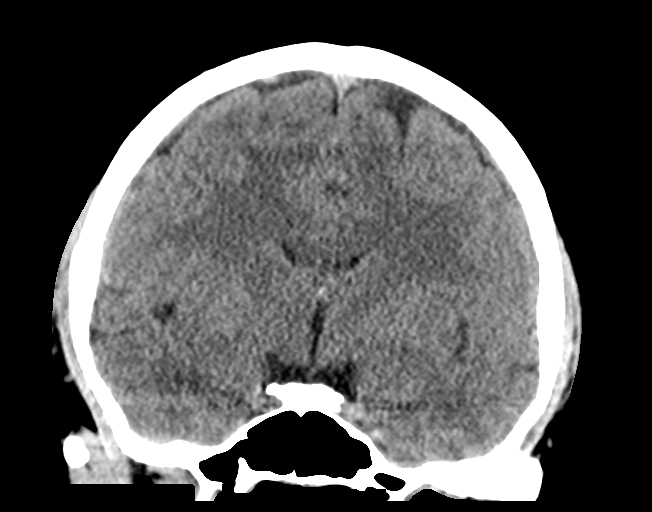

[Series 5: head wo sagittal · sagittal · 0.31mm/px · 3 of 58 slices shown]
[im 20/58  brain]
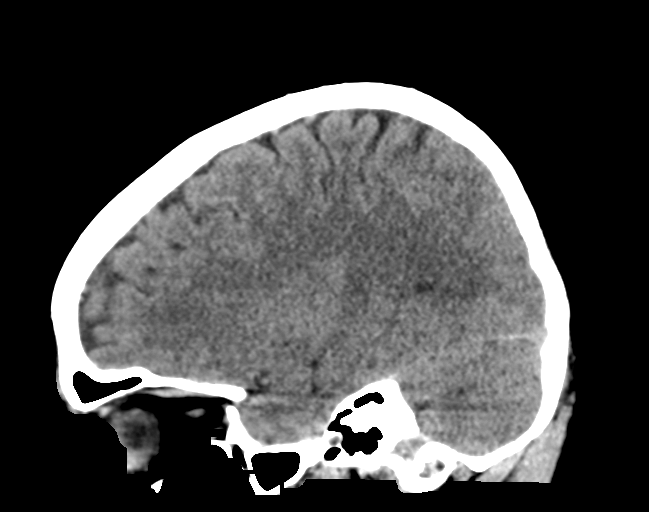
[im 29/58  brain]
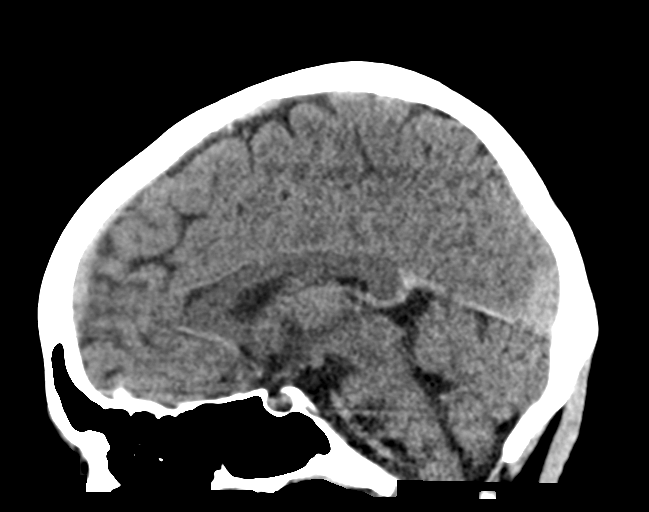
[im 39/58  brain]
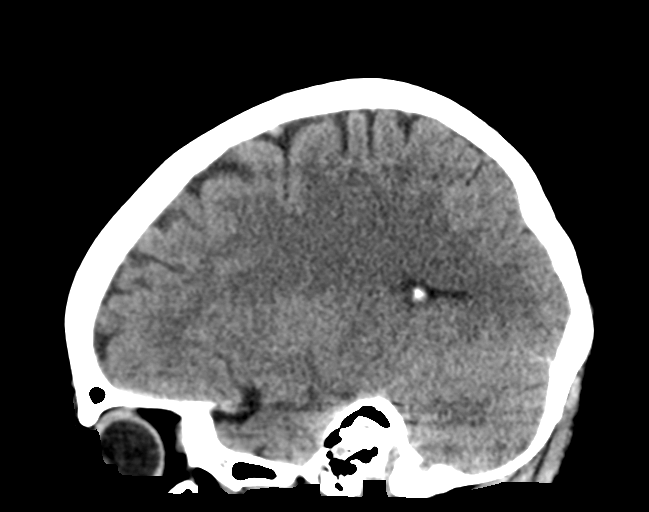

[15 of 46 positions shown; findings below may reference images not displayed]

FINDINGS: Brain: No evidence of acute infarction, hemorrhage, hydrocephalus,
extra-axial collection or mass lesion/mass effect.

Vascular: No hyperdense vessel or unexpected calcification.

Skull: Normal. Negative for fracture or focal lesion.

Sinuses/Orbits: No acute finding.
IMPRESSION: Negative head CT.

## 2021-11-01 IMAGING — US US TRANSVAGINAL NON-OB
1 series · 14 of 25 positions shown · non-contrast
Comparison: None.

CLINICAL DATA: Pelvic pain for several months.  LMP 10/22/2019.

EXAM:
ULTRASOUND PELVIS TRANSVAGINAL
TECHNIQUE: Transvaginal ultrasound examination of the pelvis was performed
including evaluation of the uterus, ovaries, adnexal regions, and
pelvic cul-de-sac.

[Series 1: us transvaginal non-ob · 0.08mm/px · 14 of 63 slices shown]
[im 1/63]
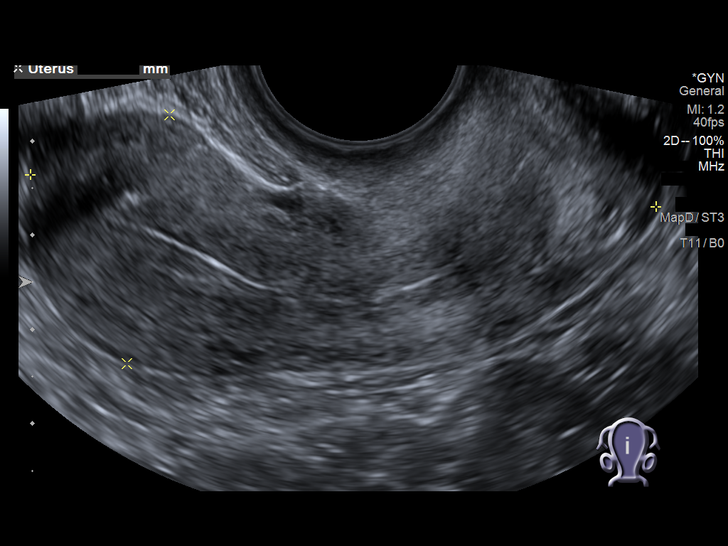
[im 6/63]
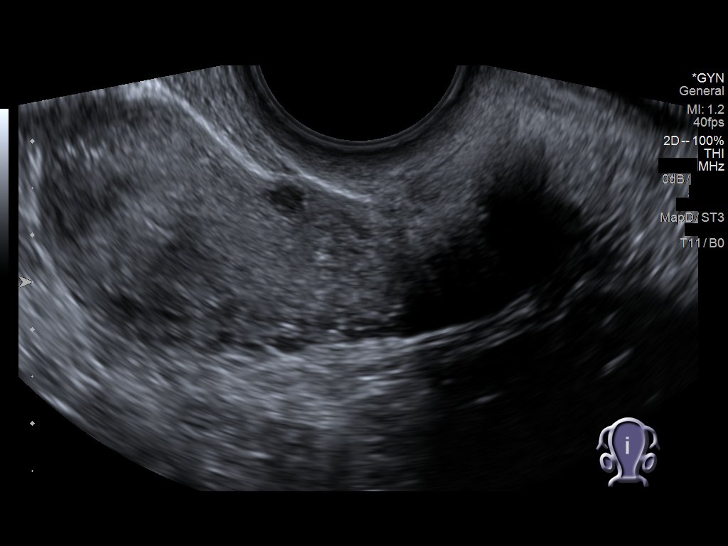
[im 11/63]
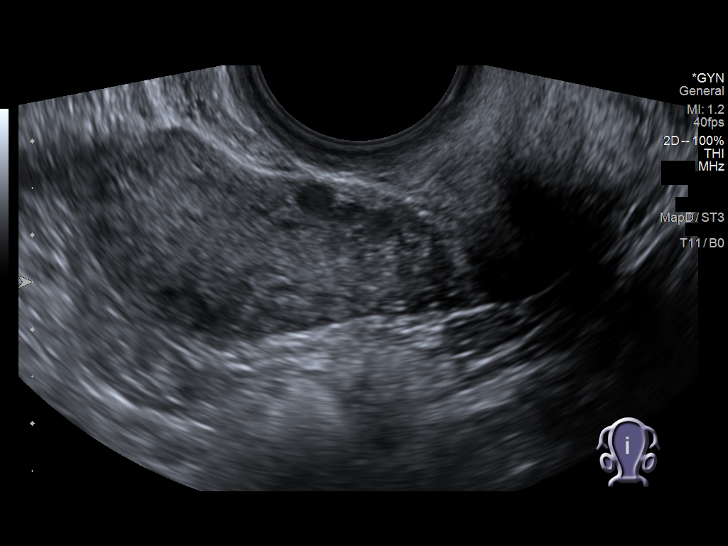
[im 16/63]
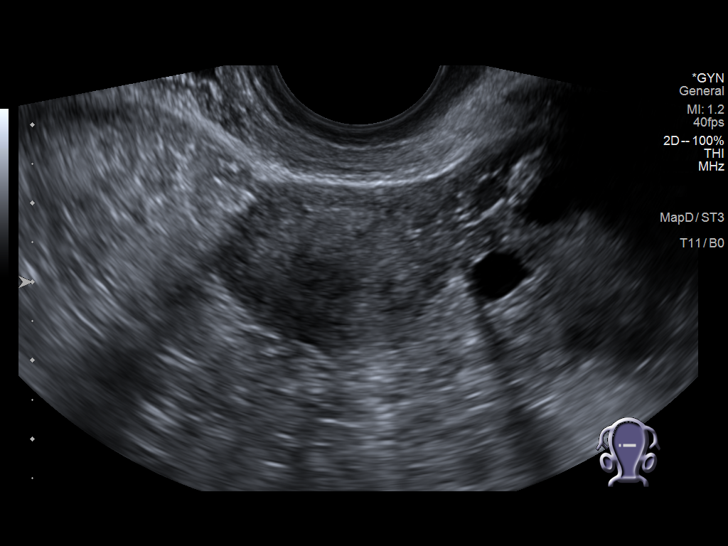
[im 21/63]
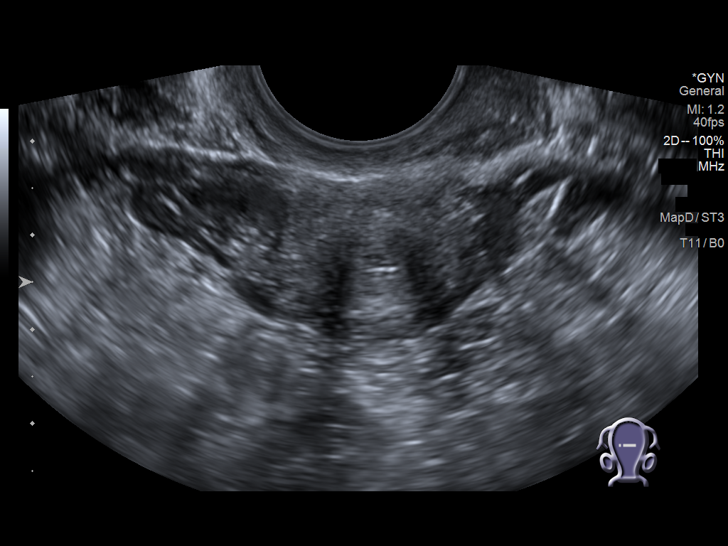
[im 24/63]
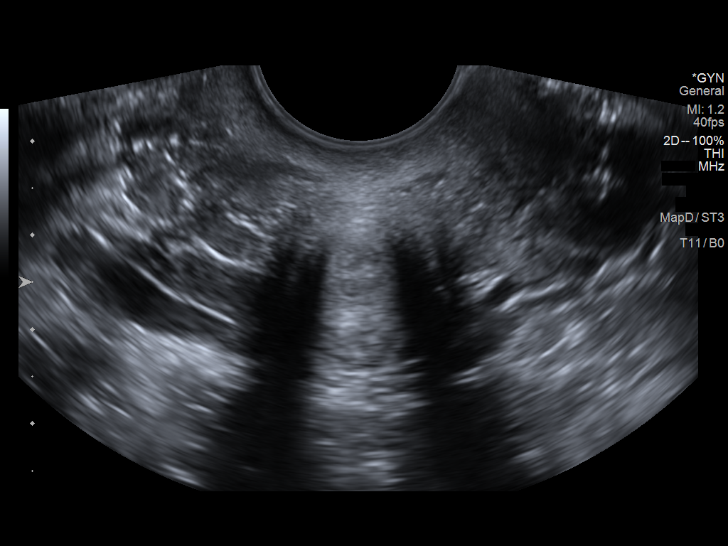
[im 29/63]
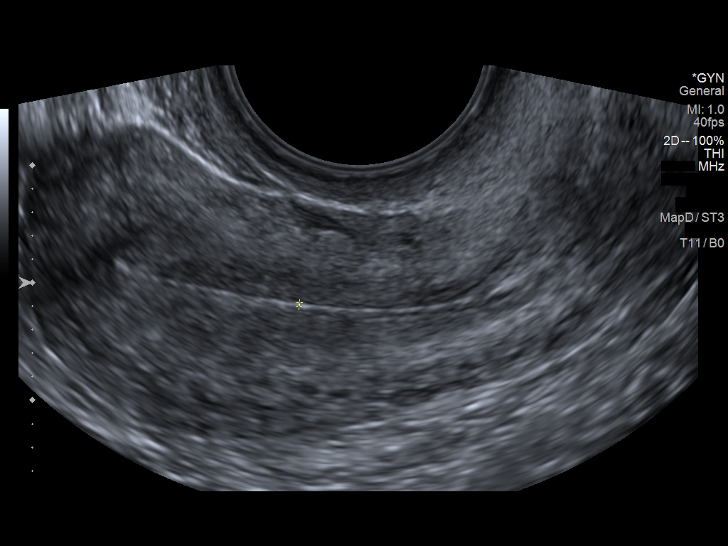
[im 34/63]
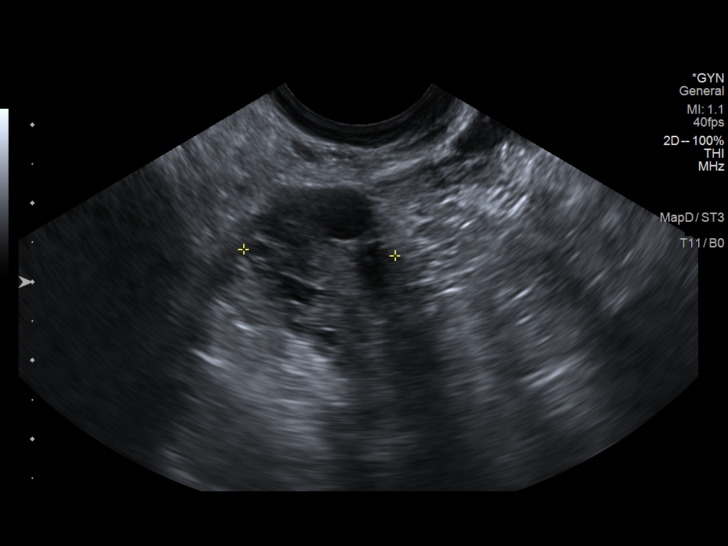
[im 39/63]
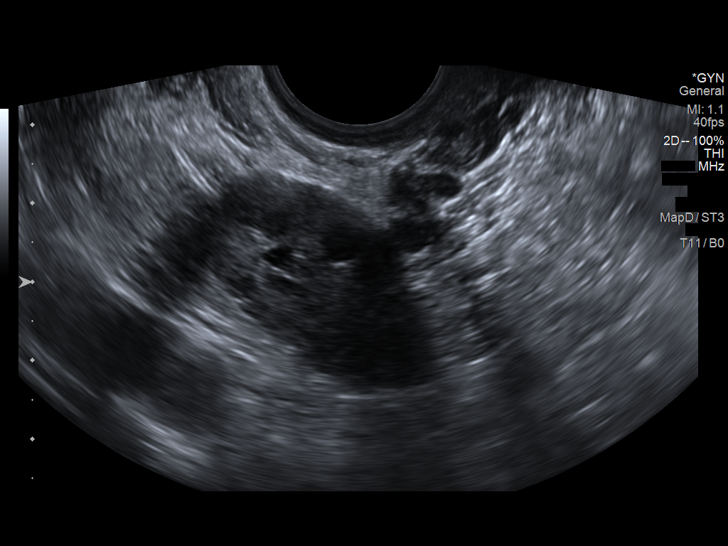
[im 42/63]
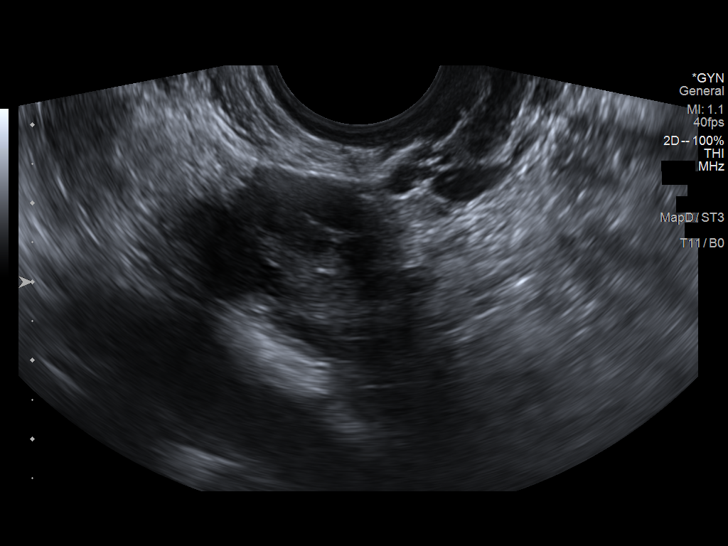
[im 47/63]
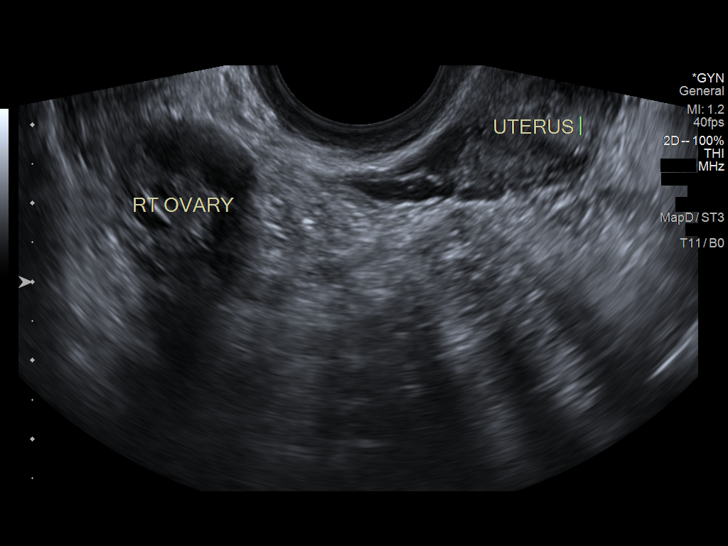
[im 52/63]
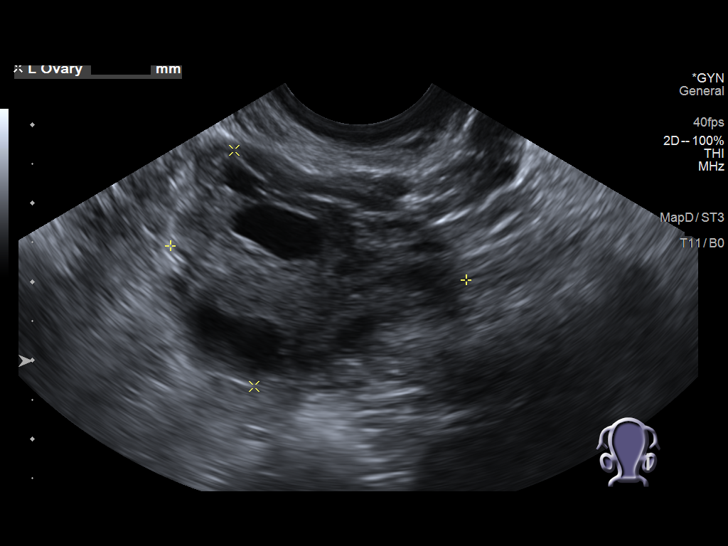
[im 57/63]
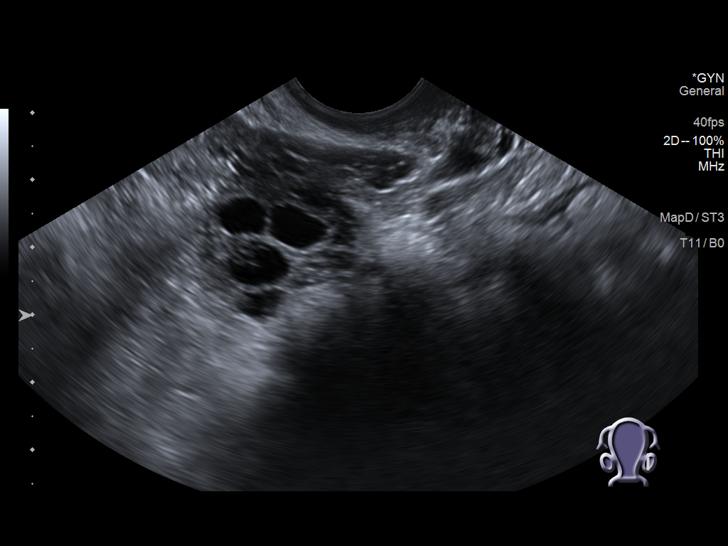
[im 63/63]
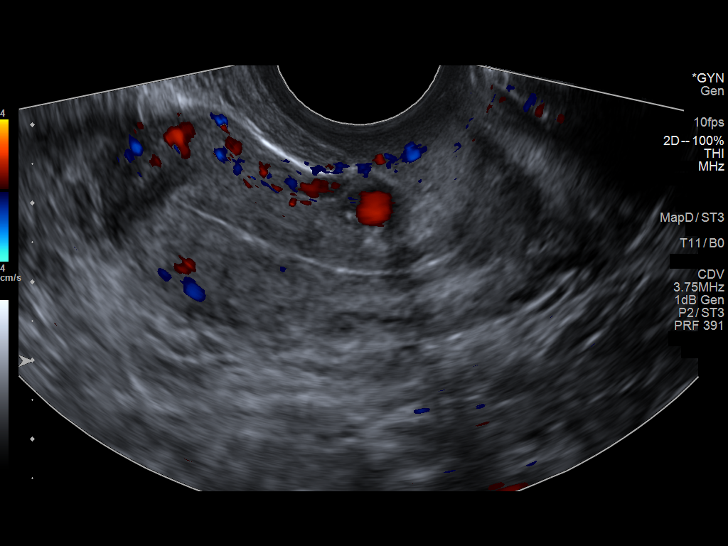

[14 of 25 positions shown; findings below may reference images not displayed]

FINDINGS: Uterus

Measurements: 6.6 x 2.7 x 3.7 cm = volume: 34 mL. No fibroids or
other mass visualized.

Endometrium

Thickness: 4 mm.  No focal abnormality visualized.

Right ovary

Measurements: 3.6 x 2.1 x 1.9 cm = volume: 8 mL. Normal
appearance/no adnexal mass.

Left ovary

Measurements: 3.8 x 3.0 x 3.1 cm = volume: 18 mL. Normal
appearance/no adnexal mass.

Other findings:  No abnormal free fluid
IMPRESSION: Negative. No pelvic mass or other significant abnormality
identified.
